# Patient Record
Sex: Female | Born: 1955 | Race: White | Hispanic: No | Marital: Married | Smoking: Never smoker
Health system: Southern US, Community
[De-identification: ages and names within clinical notes are randomized; demographics above are authoritative.]

## PROBLEM LIST (undated history)

## (undated) DIAGNOSIS — K219 Gastro-esophageal reflux disease without esophagitis: Secondary | ICD-10-CM

## (undated) DIAGNOSIS — Z973 Presence of spectacles and contact lenses: Secondary | ICD-10-CM

## (undated) DIAGNOSIS — T753XXA Motion sickness, initial encounter: Secondary | ICD-10-CM

## (undated) HISTORY — PX: ESOPHAGOGASTRODUODENOSCOPY: SHX1529

---

## 2007-12-08 ENCOUNTER — Ambulatory Visit: Payer: Self-pay | Admitting: Unknown Physician Specialty

## 2009-11-08 DIAGNOSIS — D229 Melanocytic nevi, unspecified: Secondary | ICD-10-CM

## 2009-11-08 HISTORY — DX: Melanocytic nevi, unspecified: D22.9

## 2014-05-31 ENCOUNTER — Ambulatory Visit: Payer: Self-pay | Admitting: Obstetrics and Gynecology

## 2016-06-29 ENCOUNTER — Other Ambulatory Visit: Payer: Self-pay | Admitting: Obstetrics and Gynecology

## 2016-06-29 DIAGNOSIS — Z1231 Encounter for screening mammogram for malignant neoplasm of breast: Secondary | ICD-10-CM

## 2016-06-29 DIAGNOSIS — Z13820 Encounter for screening for osteoporosis: Secondary | ICD-10-CM

## 2016-09-06 ENCOUNTER — Ambulatory Visit
Admission: RE | Admit: 2016-09-06 | Discharge: 2016-09-06 | Disposition: A | Discharge status: Home / Self Care | Payer: BC Managed Care – PPO | Source: Ambulatory Visit | Attending: Obstetrics and Gynecology | Admitting: Obstetrics and Gynecology

## 2016-09-06 ENCOUNTER — Encounter: Payer: Self-pay | Admitting: Radiology

## 2016-09-06 DIAGNOSIS — Z1231 Encounter for screening mammogram for malignant neoplasm of breast: Secondary | ICD-10-CM | POA: Diagnosis present

## 2017-02-03 ENCOUNTER — Ambulatory Visit: Payer: BC Managed Care – PPO | Admitting: Family

## 2017-07-06 ENCOUNTER — Other Ambulatory Visit: Payer: Self-pay | Admitting: Obstetrics and Gynecology

## 2017-07-06 DIAGNOSIS — Z13820 Encounter for screening for osteoporosis: Secondary | ICD-10-CM

## 2017-07-06 DIAGNOSIS — Z1231 Encounter for screening mammogram for malignant neoplasm of breast: Secondary | ICD-10-CM

## 2017-08-18 ENCOUNTER — Telehealth: Payer: Self-pay

## 2017-08-18 ENCOUNTER — Other Ambulatory Visit: Payer: Self-pay

## 2017-08-18 DIAGNOSIS — Z1211 Encounter for screening for malignant neoplasm of colon: Secondary | ICD-10-CM

## 2017-08-18 DIAGNOSIS — Z1212 Encounter for screening for malignant neoplasm of rectum: Principal | ICD-10-CM

## 2017-08-18 NOTE — Telephone Encounter (Signed)
Gastroenterology Pre-Procedure Review  Request Date: 10/19 Requesting Physician: Dr. Allen Norris  PATIENT REVIEW QUESTIONS: The patient responded to the following health history questions as indicated:    1. Are you having any GI issues? no 2. Do you have a personal history of Polyps? no 3. Do you have a family history of Colon Cancer or Polyps? no 4. Diabetes Mellitus? no 5. Joint replacements in the past 12 months?no 6. Major health problems in the past 3 months?no 7. Any artificial heart valves, MVP, or defibrillator?no    MEDICATIONS & ALLERGIES:    Patient reports the following regarding taking any anticoagulation/antiplatelet therapy:   Plavix, Coumadin, Eliquis, Xarelto, Lovenox, Pradaxa, Brilinta, or Effient? no Aspirin? no  Patient confirms/reports the following medications:  Current Outpatient Prescriptions  Medication Sig Dispense Refill  . B Complex Vitamins (VITAMIN B COMPLEX PO) Take by mouth.    . Calcium Carbonate 500 MG CHEW Chew by mouth.     No current facility-administered medications for this visit.     Patient confirms/reports the following allergies:  Allergies  Allergen Reactions  . Sulfa Antibiotics Rash    No orders of the defined types were placed in this encounter.   AUTHORIZATION INFORMATION Primary Insurance: 1D#: Group #:  Secondary Insurance: 1D#: Group #:  SCHEDULE INFORMATION: Date: 10/19  Time: Location: [place]

## 2017-08-22 ENCOUNTER — Encounter: Payer: Self-pay | Admitting: *Deleted

## 2017-08-23 NOTE — Discharge Instructions (Signed)
General Anesthesia, Adult, Care After °These instructions provide you with information about caring for yourself after your procedure. Your health care provider may also give you more specific instructions. Your treatment has been planned according to current medical practices, but problems sometimes occur. Call your health care provider if you have any problems or questions after your procedure. °What can I expect after the procedure? °After the procedure, it is common to have: °· Vomiting. °· A sore throat. °· Mental slowness. ° °It is common to feel: °· Nauseous. °· Cold or shivery. °· Sleepy. °· Tired. °· Sore or achy, even in parts of your body where you did not have surgery. ° °Follow these instructions at home: °For at least 24 hours after the procedure: °· Do not: °? Participate in activities where you could fall or become injured. °? Drive. °? Use heavy machinery. °? Drink alcohol. °? Take sleeping pills or medicines that cause drowsiness. °? Make important decisions or sign legal documents. °? Take care of children on your own. °· Rest. °Eating and drinking °· If you vomit, drink water, juice, or soup when you can drink without vomiting. °· Drink enough fluid to keep your urine clear or pale yellow. °· Make sure you have little or no nausea before eating solid foods. °· Follow the diet recommended by your health care provider. °General instructions °· Have a responsible adult stay with you until you are awake and alert. °· Return to your normal activities as told by your health care provider. Ask your health care provider what activities are safe for you. °· Take over-the-counter and prescription medicines only as told by your health care provider. °· If you smoke, do not smoke without supervision. °· Keep all follow-up visits as told by your health care provider. This is important. °Contact a health care provider if: °· You continue to have nausea or vomiting at home, and medicines are not helpful. °· You  cannot drink fluids or start eating again. °· You cannot urinate after 8-12 hours. °· You develop a skin rash. °· You have fever. °· You have increasing redness at the site of your procedure. °Get help right away if: °· You have difficulty breathing. °· You have chest pain. °· You have unexpected bleeding. °· You feel that you are having a life-threatening or urgent problem. °This information is not intended to replace advice given to you by your health care provider. Make sure you discuss any questions you have with your health care provider. °Document Released: 01/31/2001 Document Revised: 03/29/2016 Document Reviewed: 10/09/2015 °Elsevier Interactive Patient Education © 2018 [employer] ° °

## 2017-08-26 ENCOUNTER — Ambulatory Visit: Payer: BC Managed Care – PPO | Admitting: Anesthesiology

## 2017-08-26 ENCOUNTER — Encounter
Admission: RE | Disposition: A | Discharge status: Home / Self Care | Payer: Self-pay | Source: Ambulatory Visit | Attending: Gastroenterology

## 2017-08-26 ENCOUNTER — Ambulatory Visit
Admission: RE | Admit: 2017-08-26 | Discharge: 2017-08-26 | Disposition: A | Discharge status: Home / Self Care | Payer: BC Managed Care – PPO | Source: Ambulatory Visit | Attending: Gastroenterology | Admitting: Gastroenterology

## 2017-08-26 DIAGNOSIS — Z882 Allergy status to sulfonamides status: Secondary | ICD-10-CM | POA: Insufficient documentation

## 2017-08-26 DIAGNOSIS — K219 Gastro-esophageal reflux disease without esophagitis: Secondary | ICD-10-CM | POA: Diagnosis not present

## 2017-08-26 DIAGNOSIS — K635 Polyp of colon: Secondary | ICD-10-CM | POA: Diagnosis not present

## 2017-08-26 DIAGNOSIS — K640 First degree hemorrhoids: Secondary | ICD-10-CM | POA: Insufficient documentation

## 2017-08-26 DIAGNOSIS — D123 Benign neoplasm of transverse colon: Secondary | ICD-10-CM | POA: Diagnosis not present

## 2017-08-26 DIAGNOSIS — Z1211 Encounter for screening for malignant neoplasm of colon: Secondary | ICD-10-CM | POA: Diagnosis present

## 2017-08-26 DIAGNOSIS — K5730 Diverticulosis of large intestine without perforation or abscess without bleeding: Secondary | ICD-10-CM | POA: Diagnosis not present

## 2017-08-26 HISTORY — DX: Presence of spectacles and contact lenses: Z97.3

## 2017-08-26 HISTORY — DX: Gastro-esophageal reflux disease without esophagitis: K21.9

## 2017-08-26 HISTORY — PX: COLONOSCOPY WITH PROPOFOL: SHX5780

## 2017-08-26 HISTORY — PX: POLYPECTOMY: SHX5525

## 2017-08-26 HISTORY — DX: Motion sickness, initial encounter: T75.3XXA

## 2017-08-26 SURGERY — COLONOSCOPY WITH PROPOFOL
Anesthesia: General

## 2017-08-26 MED ORDER — LACTATED RINGERS IV SOLN
INTRAVENOUS | Status: DC
  Administered 2017-08-26: 11:00:00 via INTRAVENOUS

## 2017-08-26 MED ORDER — LIDOCAINE HCL (CARDIAC) 20 MG/ML IV SOLN
INTRAVENOUS | Status: DC | PRN
  Administered 2017-08-26: 50 mg via INTRAVENOUS

## 2017-08-26 MED ORDER — ACETAMINOPHEN 325 MG PO TABS: 325.0000 mg | ORAL_TABLET | Freq: Once | ORAL | Status: DC

## 2017-08-26 MED ORDER — ACETAMINOPHEN 160 MG/5ML PO SOLN: 325.0000 mg | Freq: Once | ORAL | Status: DC

## 2017-08-26 MED ORDER — PROPOFOL 10 MG/ML IV BOLUS
INTRAVENOUS | Status: DC | PRN
  Administered 2017-08-26 (×2): 40 mg via INTRAVENOUS
  Administered 2017-08-26 (×2): 20 mg via INTRAVENOUS
  Administered 2017-08-26: 40 mg via INTRAVENOUS
  Administered 2017-08-26: 100 mg via INTRAVENOUS

## 2017-08-26 MED ORDER — STERILE WATER FOR IRRIGATION IR SOLN
Status: DC | PRN
  Administered 2017-08-26: 11:00:00

## 2017-08-26 SURGICAL SUPPLY — 23 items
CANISTER SUCT 1200ML W/VALVE (MISCELLANEOUS) ×4 IMPLANT
CLIP HMST 235XBRD CATH ROT (MISCELLANEOUS) IMPLANT
CLIP RESOLUTION 360 11X235 (MISCELLANEOUS)
FCP ESCP3.2XJMB 240X2.8X (MISCELLANEOUS) ×2
FORCEPS BIOP RAD 4 LRG CAP 4 (CUTTING FORCEPS) IMPLANT
FORCEPS BIOP RJ4 240 W/NDL (MISCELLANEOUS) ×2
FORCEPS ESCP3.2XJMB 240X2.8X (MISCELLANEOUS) ×2 IMPLANT
GOWN CVR UNV OPN BCK APRN NK (MISCELLANEOUS) ×4 IMPLANT
GOWN ISOL THUMB LOOP REG UNIV (MISCELLANEOUS) ×4
INJECTOR VARIJECT VIN23 (MISCELLANEOUS) IMPLANT
KIT DEFENDO VALVE AND CONN (KITS) IMPLANT
KIT ENDO PROCEDURE OLY (KITS) ×4 IMPLANT
MARKER SPOT ENDO TATTOO 5ML (MISCELLANEOUS) IMPLANT
PAD GROUND ADULT SPLIT (MISCELLANEOUS) IMPLANT
PROBE APC STR FIRE (PROBE) IMPLANT
RETRIEVER NET ROTH 2.5X230 LF (MISCELLANEOUS) IMPLANT
SNARE SHORT THROW 13M SML OVAL (MISCELLANEOUS) IMPLANT
SNARE SHORT THROW 30M LRG OVAL (MISCELLANEOUS) IMPLANT
SNARE SNG USE RND 15MM (INSTRUMENTS) IMPLANT
SPOT EX ENDOSCOPIC TATTOO (MISCELLANEOUS)
TRAP ETRAP POLY (MISCELLANEOUS) IMPLANT
VARIJECT INJECTOR VIN23 (MISCELLANEOUS)
WATER STERILE IRR 250ML POUR (IV SOLUTION) ×4 IMPLANT

## 2017-08-26 NOTE — Anesthesia Preprocedure Evaluation (Signed)
Anesthesia Evaluation  Patient identified by MRN, date of birth, ID band Patient awake    Reviewed: Allergy & Precautions, H&P , NPO status , Patient's Chart, lab work & pertinent test results  Airway Mallampati: II  TM Distance: >3 FB Neck ROM: full    Dental no notable dental hx.    Pulmonary    Pulmonary exam normal breath sounds clear to auscultation       Cardiovascular Normal cardiovascular exam Rhythm:regular Rate:Normal     Neuro/Psych    GI/Hepatic GERD  ,  Endo/Other    Renal/GU      Musculoskeletal   Abdominal   Peds  Hematology   Anesthesia Other Findings   Reproductive/Obstetrics                             Anesthesia Physical Anesthesia Plan  ASA: II  Anesthesia Plan: General   Post-op Pain Management:    Induction:   PONV Risk Score and Plan: 3 and Propofol infusion  Airway Management Planned:   Additional Equipment:   Intra-op Plan:   Post-operative Plan:   Informed Consent: I have reviewed the patients History and Physical, chart, labs and discussed the procedure including the risks, benefits and alternatives for the proposed anesthesia with the patient or authorized representative who has indicated his/her understanding and acceptance.     Plan Discussed with: CRNA  Anesthesia Plan Comments:         Anesthesia Quick Evaluation

## 2017-08-26 NOTE — Anesthesia Procedure Notes (Signed)
Date/Time: 08/26/2017 11:08 AM Performed by: Cameron Ali Pre-anesthesia Checklist: Patient identified, Emergency Drugs available, Suction available, Timeout performed and Patient being monitored Patient Re-evaluated:Patient Re-evaluated prior to induction Oxygen Delivery Method: Nasal cannula Placement Confirmation: positive ETCO2

## 2017-08-26 NOTE — H&P (Signed)
   Lucilla Lame, MD [address] [place], [place] [address] Phone: [phone] Fax : [phone]  Primary Care Physician:  Elgie Collard, MD Primary Gastroenterologist:  Dr. Allen Norris  Pre-Procedure History & Physical: HPI:  Amber Solomon is a 61 y.o. female is here for a screening colonoscopy.   Past Medical History:  Diagnosis Date  . GERD (gastroesophageal reflux disease)   . Motion sickness    boats  . Wears contact lenses     Past Surgical History:  Procedure Laterality Date  . ESOPHAGOGASTRODUODENOSCOPY      Prior to Admission medications   Medication Sig Start Date End Date Taking? Authorizing Provider  B Complex Vitamins (VITAMIN B COMPLEX PO) Take by mouth.   Yes [provider]  Calcium Carbonate 500 MG CHEW Chew by mouth.   Yes [provider]  Multiple Vitamin (MULTIVITAMIN) tablet Take 1 tablet by mouth daily.   Yes [provider]    Allergies as of 08/18/2017 - never reviewed  Allergen Reaction Noted  . Sulfa antibiotics Rash 01/21/2016    Family History  Problem Relation Age of Onset  . Breast cancer Neg Hx     Social History   Social History  . Marital status: Married    Spouse name: N/A  . Number of children: N/A  . Years of education: N/A   Occupational History  . Not on file.   Social History Main Topics  . Smoking status: Never Smoker  . Smokeless tobacco: Never Used  . Alcohol use 3.6 oz/week    6 Cans of beer per week  . Drug use: Unknown  . Sexual activity: Not on file   Other Topics Concern  . Not on file   Social History Narrative  . No narrative on file    Review of Systems: See HPI, otherwise negative ROS  Physical Exam: BP (!) 139/98   Pulse 92   Temp 97.6 F (36.4 C) (Temporal)   Ht 4' 11.5" (1.511 m)   Wt 136 lb (61.7 kg)   SpO2 98%   BMI 27.01 kg/m  General:   Alert,  pleasant and cooperative in NAD Head:  Normocephalic and atraumatic. Neck:  Supple; no  masses or thyromegaly. Lungs:  Clear throughout to auscultation.    Heart:  Regular rate and rhythm. Abdomen:  Soft, nontender and nondistended. Normal bowel sounds, without guarding, and without rebound.   Neurologic:  Alert and  oriented x4;  grossly normal neurologically.  Impression/Plan: Amber Solomon is now here to undergo a screening colonoscopy.  Risks, benefits, and alternatives regarding colonoscopy have been reviewed with the patient.  Questions have been answered.  All parties agreeable.

## 2017-08-26 NOTE — Op Note (Addendum)
[facility] Gastroenterology Patient Name: Amber Solomon Procedure Date: 08/26/2017 11:03 AM MRN: [id number] Account #: [id number] Date of Birth: [birth year] Admit Type: Outpatient Age: 61 Room: [facility] OR ROOM 01 Gender: Female Note Status: Finalized Procedure:            Colonoscopy Indications:          Screening for colorectal malignant neoplasm Providers:            Lucilla Lame MD, MD Referring MD:         Shelby Mattocks. Georga Bora, MD (Referring MD) Medicines:            Propofol per Anesthesia Complications:        No immediate complications. Procedure:            Pre-Anesthesia Assessment:                       - Prior to the procedure, a History and Physical was                        performed, and patient medications and allergies were                        reviewed. The patient's tolerance of previous                        anesthesia was also reviewed. The risks and benefits of                        the procedure and the sedation options and risks were                        discussed with the patient. All questions were                        answered, and informed consent was obtained. Prior                        Anticoagulants: The patient has taken no previous                        anticoagulant or antiplatelet agents. ASA Grade                        Assessment: II - A patient with mild systemic disease.                        After reviewing the risks and benefits, the patient was                        deemed in satisfactory condition to undergo the                        procedure.                       After obtaining informed consent, the colonoscope was                        passed under direct vision. Throughout the procedure,  the patient's blood pressure, pulse, and oxygen                        saturations were monitored continuously. The [place] (S#: [id number]) was  introduced through                        the anus and advanced to the the cecum, identified by                        appendiceal orifice and ileocecal valve. The entire                        colon was examined. Findings:      The perianal and digital rectal examinations were normal.      A 4 mm polyp was found in the transverse colon. The polyp was sessile.       The polyp was removed with a cold biopsy forceps. Resection and       retrieval were complete.      A few small-mouthed diverticula were found in the sigmoid colon.      Non-bleeding internal hemorrhoids were found during retroflexion. The       hemorrhoids were Grade I (internal hemorrhoids that do not prolapse). Impression:           - One 4 mm polyp in the transverse colon, removed with                        a cold biopsy forceps. Resected and retrieved.                       - Diverticulosis in the sigmoid colon.                       - Non-bleeding internal hemorrhoids. Recommendation:       - Discharge patient to home.                       - Resume previous diet.                       - Continue present medications.                       - Await pathology results.                       - Repeat colonoscopy in 5 years if polyp adenoma and 10                        years if hyperplastic Procedure Code(s):    --- Professional ---                       [phone], Colonoscopy, flexible; with biopsy, single or                        multiple Diagnosis Code(s):    --- Professional ---  Z12.11, Encounter for screening for malignant neoplasm                        of colon                       D12.3, Benign neoplasm of transverse colon (hepatic                        flexure or splenic flexure) CPT copyright 2016 American Medical Association. All rights reserved. The codes documented in this report are preliminary and upon coder review may  be revised to meet current compliance requirements. Lucilla Lame  MD, MD 08/26/2017 11:30:21 AM This report has been signed electronically. Number of Addenda: 0 Note Initiated On: 08/26/2017 11:03 AM Scope Withdrawal Time: 0 hours 6 minutes 7 seconds  Total Procedure Duration: 0 hours 9 minutes 21 seconds       [facility]

## 2017-08-26 NOTE — Transfer of Care (Signed)
Immediate Anesthesia Transfer of Care Note  Patient: Amber Solomon  Procedure(s) Performed: COLONOSCOPY WITH PROPOFOL (N/A )  Patient Location: PACU  Anesthesia Type: General  Level of Consciousness: awake, alert  and patient cooperative  Airway and Oxygen Therapy: Patient Spontanous Breathing and Patient connected to supplemental oxygen  Post-op Assessment: Post-op Vital signs reviewed, Patient's Cardiovascular Status Stable, Respiratory Function Stable, Patent Airway and No signs of Nausea or vomiting  Post-op Vital Signs: Reviewed and stable  Complications: No apparent anesthesia complications

## 2017-08-26 NOTE — Anesthesia Postprocedure Evaluation (Signed)
Anesthesia Post Note  Patient: Amber Solomon  Procedure(s) Performed: COLONOSCOPY WITH PROPOFOL (N/A ) POLYPECTOMY  Patient location during evaluation: PACU Anesthesia Type: General Level of consciousness: awake and alert and oriented Pain management: satisfactory to patient Vital Signs Assessment: post-procedure vital signs reviewed and stable Respiratory status: spontaneous breathing, nonlabored ventilation and respiratory function stable Cardiovascular status: blood pressure returned to baseline and stable Postop Assessment: Adequate PO intake and No signs of nausea or vomiting Anesthetic complications: no    Raliegh Ip

## 2017-08-29 ENCOUNTER — Encounter: Payer: Self-pay | Admitting: Gastroenterology

## 2017-08-30 ENCOUNTER — Encounter: Payer: Self-pay | Admitting: Gastroenterology

## 2017-09-01 ENCOUNTER — Encounter: Payer: Self-pay | Admitting: Gastroenterology

## 2017-09-08 ENCOUNTER — Ambulatory Visit
Admission: RE | Admit: 2017-09-08 | Discharge: 2017-09-08 | Disposition: A | Discharge status: Home / Self Care | Payer: BC Managed Care – PPO | Source: Ambulatory Visit | Attending: Obstetrics and Gynecology | Admitting: Obstetrics and Gynecology

## 2017-09-08 DIAGNOSIS — Z1231 Encounter for screening mammogram for malignant neoplasm of breast: Secondary | ICD-10-CM | POA: Diagnosis not present

## 2019-01-15 ENCOUNTER — Other Ambulatory Visit: Payer: Self-pay | Admitting: Obstetrics and Gynecology

## 2019-01-15 DIAGNOSIS — Z1231 Encounter for screening mammogram for malignant neoplasm of breast: Secondary | ICD-10-CM

## 2019-01-15 DIAGNOSIS — Z13820 Encounter for screening for osteoporosis: Secondary | ICD-10-CM

## 2019-01-31 ENCOUNTER — Other Ambulatory Visit: Payer: BC Managed Care – PPO

## 2019-01-31 ENCOUNTER — Ambulatory Visit: Payer: BC Managed Care – PPO

## 2019-06-11 ENCOUNTER — Ambulatory Visit: Payer: BC Managed Care – PPO

## 2019-10-11 ENCOUNTER — Ambulatory Visit
Admission: RE | Admit: 2019-10-11 | Discharge: 2019-10-11 | Disposition: A | Discharge status: Home / Self Care | Payer: BC Managed Care – PPO | Source: Ambulatory Visit | Attending: Obstetrics and Gynecology | Admitting: Obstetrics and Gynecology

## 2019-10-11 ENCOUNTER — Other Ambulatory Visit: Payer: Self-pay

## 2019-10-11 ENCOUNTER — Encounter (INDEPENDENT_AMBULATORY_CARE_PROVIDER_SITE_OTHER): Payer: Self-pay

## 2019-10-11 DIAGNOSIS — Z1231 Encounter for screening mammogram for malignant neoplasm of breast: Secondary | ICD-10-CM | POA: Diagnosis not present

## 2019-11-12 ENCOUNTER — Ambulatory Visit: Payer: BC Managed Care – PPO

## 2020-12-25 ENCOUNTER — Other Ambulatory Visit: Payer: Self-pay | Admitting: Obstetrics and Gynecology

## 2020-12-25 DIAGNOSIS — Z1231 Encounter for screening mammogram for malignant neoplasm of breast: Secondary | ICD-10-CM

## 2021-01-12 ENCOUNTER — Ambulatory Visit
Admission: RE | Admit: 2021-01-12 | Discharge: 2021-01-12 | Disposition: A | Discharge status: Home / Self Care | Payer: BC Managed Care – PPO | Source: Ambulatory Visit | Attending: Obstetrics and Gynecology | Admitting: Obstetrics and Gynecology

## 2021-01-12 DIAGNOSIS — Z1231 Encounter for screening mammogram for malignant neoplasm of breast: Secondary | ICD-10-CM | POA: Diagnosis not present

## 2021-02-17 ENCOUNTER — Encounter: Payer: BC Managed Care – PPO | Admitting: Dermatology

## 2021-02-24 ENCOUNTER — Encounter: Payer: BC Managed Care – PPO | Admitting: Dermatology

## 2021-11-17 ENCOUNTER — Ambulatory Visit: Payer: BC Managed Care – PPO | Admitting: Dermatology

## 2021-12-24 ENCOUNTER — Other Ambulatory Visit: Payer: Self-pay | Admitting: Family Medicine

## 2021-12-24 DIAGNOSIS — Z1231 Encounter for screening mammogram for malignant neoplasm of breast: Secondary | ICD-10-CM

## 2022-03-01 ENCOUNTER — Ambulatory Visit
Admission: RE | Admit: 2022-03-01 | Discharge: 2022-03-01 | Disposition: A | Discharge status: Home / Self Care | Payer: Medicare PPO | Source: Ambulatory Visit | Attending: Family Medicine | Admitting: Family Medicine

## 2022-03-01 DIAGNOSIS — Z1231 Encounter for screening mammogram for malignant neoplasm of breast: Secondary | ICD-10-CM | POA: Diagnosis present

## 2022-07-16 IMAGING — MG MM DIGITAL SCREENING BILAT W/ TOMO AND CAD
8 series · 8 of 24 positions shown · non-contrast
Comparison: Previous exam(s).

CLINICAL DATA: Screening.

EXAM:
DIGITAL SCREENING BILATERAL MAMMOGRAM WITH TOMOSYNTHESIS AND CAD
TECHNIQUE: Bilateral screening digital craniocaudal and mediolateral oblique
mammograms were obtained. Bilateral screening digital breast
tomosynthesis was performed. The images were evaluated with
computer-aided detection.

[R MLO synth-2D]
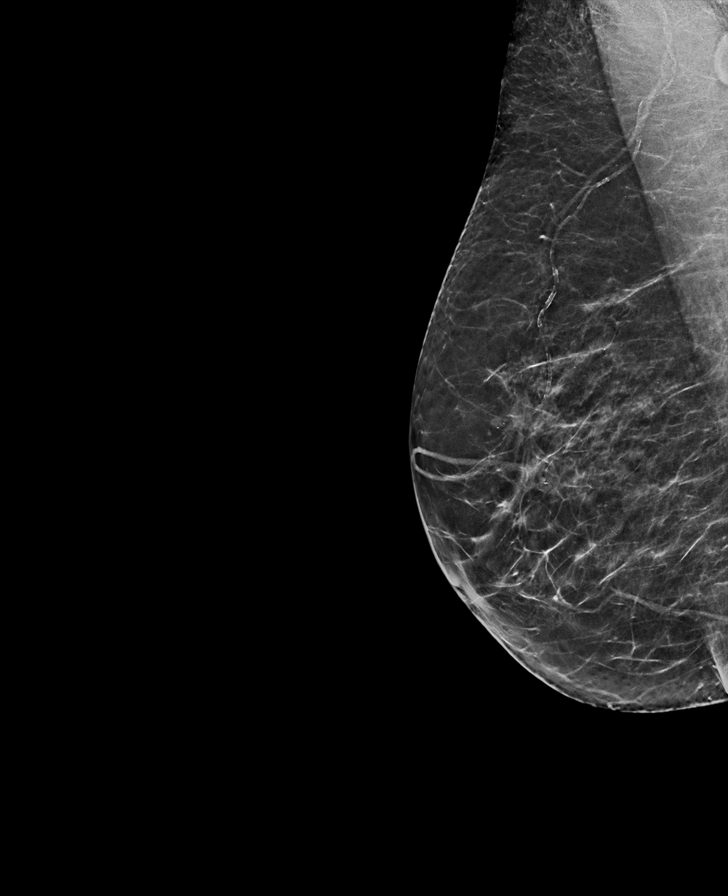

[L MLO synth-2D]
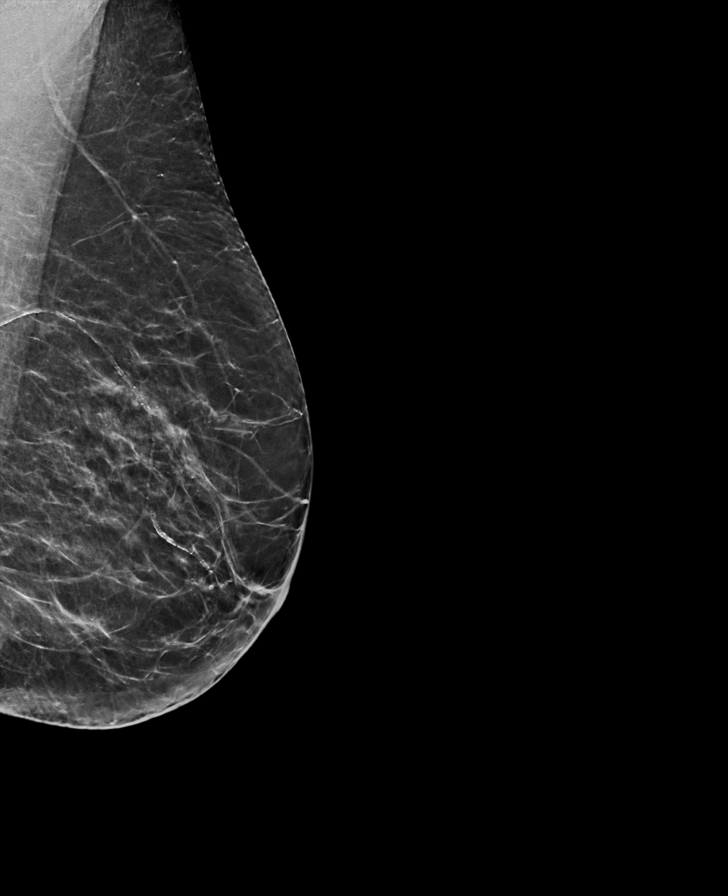

[L CC synth-2D]
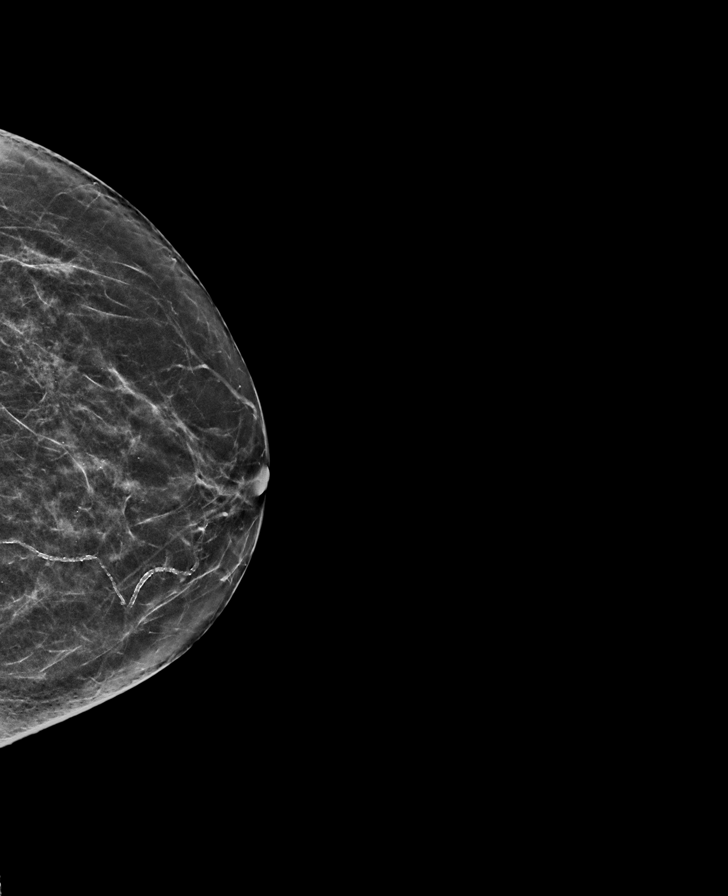

[R CC synth-2D]
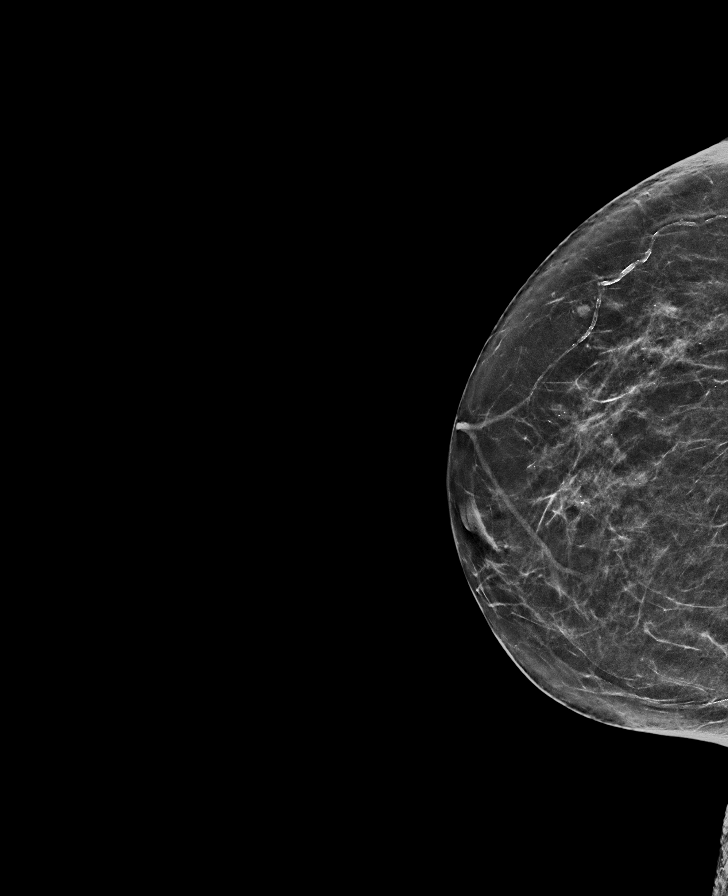

[R CC tomo · tomo slice 31/62.0]
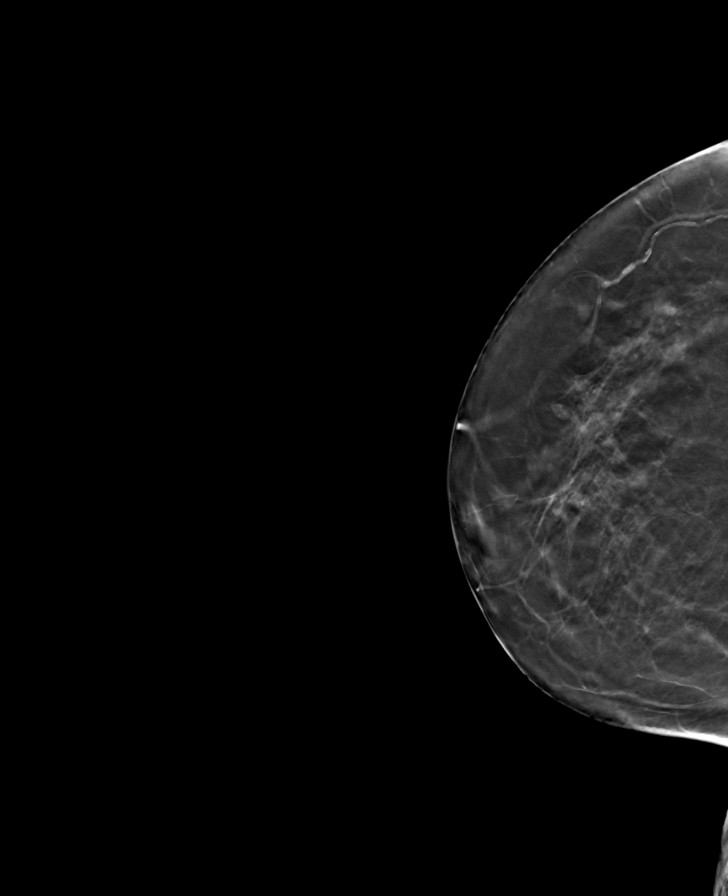

[L CC tomo · tomo slice 30/59.0]
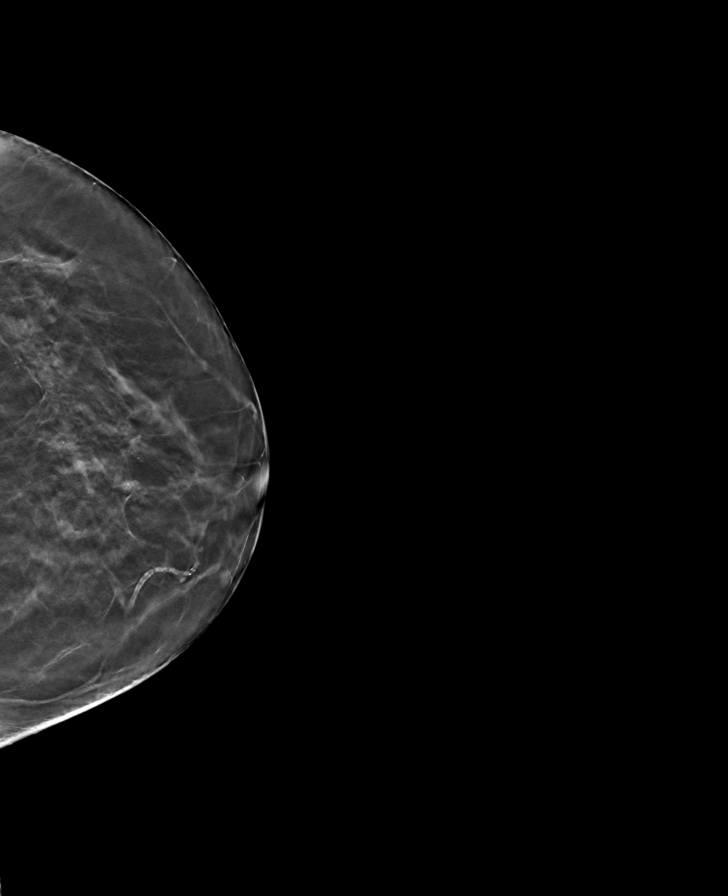

[R MLO tomo · tomo slice 32/63.0]
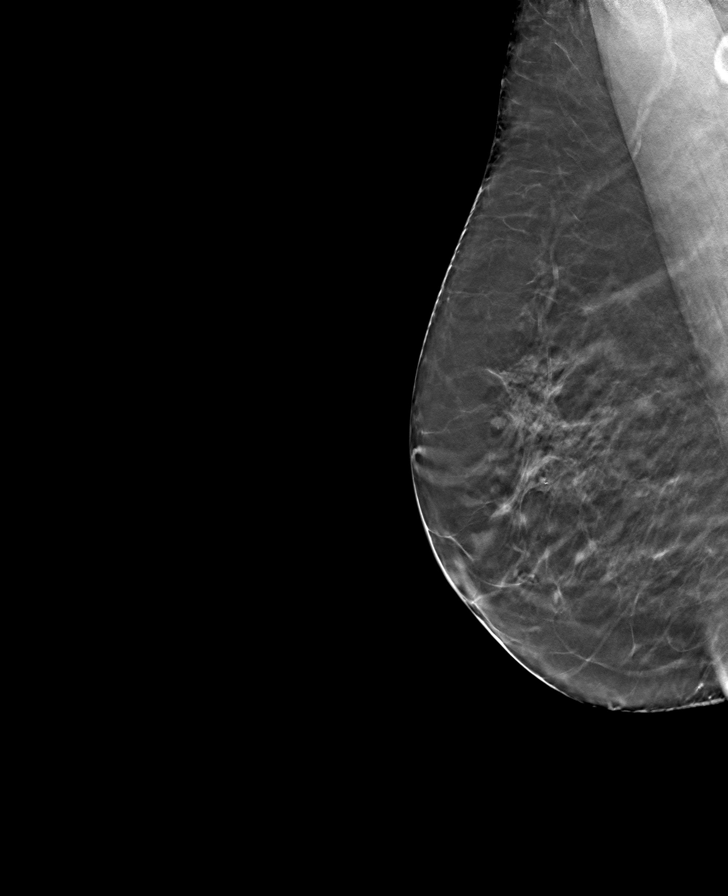

[L MLO tomo · tomo slice 33/64.0]
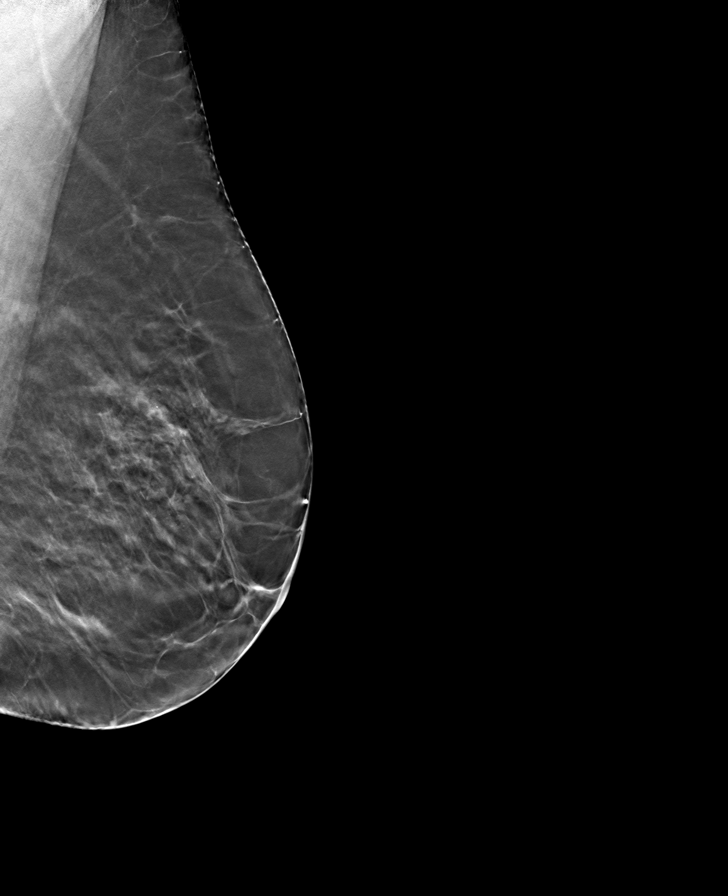

[8 of 24 positions shown; findings below may reference images not displayed]

ACR Breast Density Category b: There are scattered areas of
fibroglandular density.
FINDINGS: There are no findings suspicious for malignancy.
IMPRESSION: No mammographic evidence of malignancy. A result letter of this
screening mammogram will be mailed directly to the patient.

RECOMMENDATION:
Screening mammogram in one year. (Code:51-O-LD2)

BI-RADS CATEGORY  1: Negative.

## 2022-08-04 ENCOUNTER — Ambulatory Visit: Payer: Self-pay | Admitting: Family Medicine

## 2022-12-14 ENCOUNTER — Ambulatory Visit: Payer: Medicare PPO | Admitting: Dermatology

## 2022-12-14 ENCOUNTER — Encounter: Payer: Self-pay | Admitting: Dermatology

## 2022-12-14 VITALS — BP 153/84 | HR 80

## 2022-12-14 DIAGNOSIS — D2272 Melanocytic nevi of left lower limb, including hip: Secondary | ICD-10-CM

## 2022-12-14 DIAGNOSIS — Z1283 Encounter for screening for malignant neoplasm of skin: Secondary | ICD-10-CM | POA: Diagnosis not present

## 2022-12-14 DIAGNOSIS — D2371 Other benign neoplasm of skin of right lower limb, including hip: Secondary | ICD-10-CM

## 2022-12-14 DIAGNOSIS — D225 Melanocytic nevi of trunk: Secondary | ICD-10-CM | POA: Diagnosis not present

## 2022-12-14 DIAGNOSIS — Z86018 Personal history of other benign neoplasm: Secondary | ICD-10-CM

## 2022-12-14 DIAGNOSIS — D229 Melanocytic nevi, unspecified: Secondary | ICD-10-CM

## 2022-12-14 DIAGNOSIS — L578 Other skin changes due to chronic exposure to nonionizing radiation: Secondary | ICD-10-CM

## 2022-12-14 DIAGNOSIS — D2372 Other benign neoplasm of skin of left lower limb, including hip: Secondary | ICD-10-CM

## 2022-12-14 DIAGNOSIS — L719 Rosacea, unspecified: Secondary | ICD-10-CM

## 2022-12-14 DIAGNOSIS — L820 Inflamed seborrheic keratosis: Secondary | ICD-10-CM | POA: Diagnosis not present

## 2022-12-14 DIAGNOSIS — L821 Other seborrheic keratosis: Secondary | ICD-10-CM

## 2022-12-14 DIAGNOSIS — L720 Epidermal cyst: Secondary | ICD-10-CM

## 2022-12-14 DIAGNOSIS — L918 Other hypertrophic disorders of the skin: Secondary | ICD-10-CM

## 2022-12-14 DIAGNOSIS — L814 Other melanin hyperpigmentation: Secondary | ICD-10-CM

## 2022-12-14 NOTE — Patient Instructions (Addendum)
Cryotherapy Aftercare  Wash gently with soap and water everyday.   Apply Vaseline and Band-Aid daily until healed.    Melanoma ABCDEs  Melanoma is the most dangerous type of skin cancer, and is the leading cause of death from skin disease.  You are more likely to develop melanoma if you: Have light-colored skin, light-colored eyes, or red or blond hair Spend a lot of time in the sun Tan regularly, either outdoors or in a tanning bed Have had blistering sunburns, especially during childhood Have a close family member who has had a melanoma Have atypical moles or large birthmarks  Early detection of melanoma is key since treatment is typically straightforward and cure rates are extremely high if we catch it early.   The first sign of melanoma is often a change in a mole or a new dark spot.  The ABCDE system is a way of remembering the signs of melanoma.  A for asymmetry:  The two halves do not match. B for border:  The edges of the growth are irregular. C for color:  A mixture of colors are present instead of an even brown color. D for diameter:  Melanomas are usually (but not always) greater than 63m - the size of a pencil eraser. E for evolution:  The spot keeps changing in size, shape, and color.  Please check your skin once per month between visits. You can use a small mirror in front and a large mirror behind you to keep an eye on the back side or your body.   If you see any new or changing lesions before your next follow-up, please call to schedule a visit.  Please continue daily skin protection including broad spectrum sunscreen SPF 30+ to sun-exposed areas, reapplying every 2 hours as needed when you're outdoors.   Staying in the shade or wearing long sleeves, sun glasses (UVA+UVB protection) and wide brim hats (4-inch brim around the entire circumference of the hat) are also recommended for sun protection.     Seborrheic Keratosis  What causes seborrheic  keratoses? Seborrheic keratoses are harmless, common skin growths that first appear during adult life.  As time goes by, more growths appear.  Some people may develop a large number of them.  Seborrheic keratoses appear on both covered and uncovered body parts.  They are not caused by sunlight.  The tendency to develop seborrheic keratoses can be inherited.  They vary in color from skin-colored to gray, brown, or even black.  They can be either smooth or have a rough, warty surface.   Seborrheic keratoses are superficial and look as if they were stuck on the skin.  Under the microscope this type of keratosis looks like layers upon layers of skin.  That is why at times the top layer may seem to fall off, but the rest of the growth remains and re-grows.    Treatment Seborrheic keratoses do not need to be treated, but can easily be removed in the office.  Seborrheic keratoses often cause symptoms when they rub on clothing or jewelry.  Lesions can be in the way of shaving.  If they become inflamed, they can cause itching, soreness, or burning.  Removal of a seborrheic keratosis can be accomplished by freezing, burning, or surgery. If any spot bleeds, scabs, or grows rapidly, please return to have it checked, as these can be an indication of a skin cancer.   Due to recent changes in healthcare laws, you may see results of your pathology  and/or laboratory studies on MyChart before the doctors have had a chance to review them. We understand that in some cases there may be results that are confusing or concerning to you. Please understand that not all results are received at the same time and often the doctors may need to interpret multiple results in order to provide you with the best plan of care or course of treatment. Therefore, we ask that you please give [place] 2 business days to thoroughly review all your results before contacting the office for clarification. Should we see a critical lab result, you will be  contacted sooner.   If You Need Anything After Your Visit  If you have any questions or concerns for your doctor, please call our main line at [phone] and press option 4 to reach your doctor's medical assistant. If no one answers, please leave a voicemail as directed and we will return your call as soon as possible. Messages left after 4 pm will be answered the following business day.   You may also send [place] a message via [place]. We typically respond to MyChart messages within 1-2 business days.  For prescription refills, please ask your pharmacy to contact our office. Our fax number is [phone].  If you have an urgent issue when the clinic is closed that cannot wait until the next business day, you can page your doctor at the number below.    Please note that while we do our best to be available for urgent issues outside of office hours, we are not available 24/7.   If you have an urgent issue and are unable to reach [place], you may choose to seek medical care at your doctor's office, retail clinic, urgent care center, or emergency room.  If you have a medical emergency, please immediately call 911 or go to the emergency department.  Pager Numbers  - Dr. Nehemiah Massed: [phone]  - Dr. Laurence Ferrari: [phone]  - Dr. Nicole Kindred: [phone]  In the event of inclement weather, please call our main line at [phone] for an update on the status of any delays or closures.  Dermatology Medication Tips: Please keep the boxes that topical medications come in in order to help keep track of the instructions about where and how to use these. Pharmacies typically print the medication instructions only on the boxes and not directly on the medication tubes.   If your medication is too expensive, please contact our office at [phone] option 4 or send [place] a message through [place].   We are unable to tell what your co-pay for medications will be in advance as this is different depending on your  insurance coverage. However, we may be able to find a substitute medication at lower cost or fill out paperwork to get insurance to cover a needed medication.   If a prior authorization is required to get your medication covered by your insurance company, please allow [place] 1-2 business days to complete this process.  Drug prices often vary depending on where the prescription is filled and some pharmacies may offer cheaper prices.  The website www.goodrx.com contains coupons for medications through different pharmacies. The prices here do not account for what the cost may be with help from insurance (it may be cheaper with your insurance), but the website can give you the price if you did not use any insurance.  - You can print the associated coupon and take it with your prescription to the pharmacy.  - You may also stop by our  office during regular business hours and pick up a GoodRx coupon card.  - If you need your prescription sent electronically to a different pharmacy, notify our office through [facility] or by phone at [phone] option 4.     Si Usted Necesita Algo Despus de Su Visita  Tambin puede enviarnos un mensaje a travs de Pharmacist, community. Por lo general respondemos a los mensajes de MyChart en el transcurso de 1 a 2 das hbiles.  Para renovar recetas, por favor pida a su farmacia que se ponga en contacto con nuestra oficina. Harland Dingwall de fax es [place] [phone].  Si tiene un asunto urgente cuando la clnica est cerrada y que no puede esperar hasta el siguiente da hbil, puede llamar/localizar a su doctor(a) al nmero que aparece a continuacin.   Por favor, tenga en cuenta que aunque hacemos todo lo posible para estar disponibles para asuntos urgentes fuera del horario de [place], no estamos disponibles las 24 horas del da, los 7 das de la [place].   Si tiene un problema urgente y no puede comunicarse con nosotros, puede optar por buscar atencin mdica  en el  consultorio de su doctor(a), en una clnica privada, en un centro de atencin urgente o en una sala de emergencias.  Si tiene Engineering geologist, por favor llame inmediatamente al 911 o vaya a la sala de emergencias.  Nmeros de bper  - Dr. Nehemiah Massed: [phone]  - Dra. Moye: [phone]  - Dra. Nicole Kindred: [phone]  En caso de inclemencias del [place], por favor llame a Johnsie Kindred principal al [phone] para una actualizacin sobre el [place] de cualquier retraso o cierre.  Consejos para la medicacin en dermatologa: Por favor, guarde las cajas en las que vienen los medicamentos de uso tpico para ayudarle a seguir las instrucciones sobre dnde y cmo usarlos. Las farmacias generalmente imprimen las instrucciones del medicamento slo en las cajas y no directamente en los tubos del [place].   Si su medicamento es muy caro, por favor, pngase en contacto con Zigmund Daniel llamando al [phone] y presione la opcin 4 o envenos un mensaje a travs de Pharmacist, community.   No podemos decirle cul ser su copago por los medicamentos por adelantado ya que esto es diferente dependiendo de la cobertura de su seguro. Sin embargo, es posible que podamos encontrar un medicamento sustituto a Electrical engineer un formulario para que el seguro cubra el medicamento que se considera necesario.   Si se requiere una autorizacin previa para que su compaa de seguros [place] su medicamento, por favor permtanos de 1 a 2 das hbiles para completar este proceso.  Los precios de los medicamentos varan con frecuencia dependiendo del Environmental consultant de dnde se surte la receta y alguna farmacias pueden ofrecer precios ms baratos.  El sitio web www.goodrx.com tiene cupones para medicamentos de Airline pilot. Los precios aqu no tienen en cuenta lo que podra costar con la ayuda del seguro (puede ser ms barato con su seguro), pero el sitio web puede darle el precio si no utiliz Research scientist (physical sciences).  - Puede  imprimir el cupn correspondiente y llevarlo con su receta a la farmacia.  - Tambin puede pasar por nuestra oficina durante el horario de atencin regular y Charity fundraiser una tarjeta de cupones de GoodRx.  - Si necesita que su receta se enve electrnicamente a una farmacia diferente, informe a nuestra oficina a travs de MyChart de [place] o por telfono llamando al [phone] y presione la opcin 4.

## 2022-12-14 NOTE — Progress Notes (Signed)
Follow-Up Visit   Subjective  Amber Solomon is a 67 y.o. female who presents for the following: TBSE.  The patient presents for Total-Body Skin Exam (TBSE) for skin cancer screening and mole check.  The patient has spots, moles and lesions to be evaluated, some may be new or changing. She has a spot on her right lower neck that she thinks has gotten darker and gets caught on jewelry. Also a growth on her left forehead she would like checked. History of dysplastic nevus of the right lateral back inferior.      The following portions of the chart were reviewed this encounter and updated as appropriate:       Review of Systems:  No other skin or systemic complaints except as noted in HPI or Assessment and Plan.  Objective  Well appearing patient in no apparent distress; mood and affect are within normal limits.  A full examination was performed including scalp, head, eyes, ears, nose, lips, neck, chest, axillae, abdomen, back, buttocks, bilateral upper extremities, bilateral lower extremities, hands, feet, fingers, toes, fingernails, and toenails. All findings within normal limits unless otherwise noted below.  L med ankle 3.0 mm speckled brown papule  R epigastric 5.0 mm med dark brown macule  L epigastric 2.5 mm med dark brown macule         Head - Anterior (Face) Mid face erythema with telangiectasias, few small papules   R lower neck x 1 Erythematous stuck-on, waxy papule   L spinal upper back Subcutaneous nodule with punctum    Assessment & Plan  Skin cancer screening performed today.  Actinic Damage - chronic, secondary to cumulative UV radiation exposure/sun exposure over time - diffuse scaly erythematous macules with underlying dyspigmentation - Recommend daily broad spectrum sunscreen SPF 30+ to sun-exposed areas, reapply every 2 hours as needed.  - Recommend staying in the shade or wearing long sleeves, sun glasses (UVA+UVB protection) and wide  brim hats (4-inch brim around the entire circumference of the hat). - Call for new or changing lesions.  Lentigines - Scattered tan macules - Due to sun exposure - Benign-appearing, observe - Recommend daily broad spectrum sunscreen SPF 30+ to sun-exposed areas, reapply every 2 hours as needed. - Call for any changes  Seborrheic Keratoses - Stuck-on, waxy, tan-brown papules and/or plaques, including left forehead  - Benign-appearing - Discussed benign etiology and prognosis. - Observe - Call for any changes  Acrochordons (Skin Tags) - Fleshy, skin-colored pedunculated papules, L lower back - Benign appearing.  - Observe. - If desired, they can be removed with an in office procedure that is not covered by insurance. - Please call the clinic if you notice any new or changing lesions.  Hemangiomas - Red papules - Discussed benign nature - Observe - Call for any changes  Dermatofibroma - Firm pink/brown papulenodule with dimple sign, L post upper arm, R ankle, L calf - Benign appearing - Call for any changes  Melanocytic Nevi - Tan-brown and/or pink-flesh-colored symmetric macules and papules - Benign appearing on exam today - Observation - Call clinic for new or changing moles - Recommend daily use of broad spectrum spf 30+ sunscreen to sun-exposed areas.   History of Dysplastic Nevus - No evidence of recurrence today of the right lateral back inferior - Recommend regular full body skin exams - Recommend daily broad spectrum sunscreen SPF 30+ to sun-exposed areas, reapply every 2 hours as needed.  - Call if any new or changing lesions are noted  between office visits  Nevus (3) L med ankle; L epigastric; R epigastric  Benign-appearing, stable from previous visit 12/24/2019.  Observation.  Call clinic for new or changing moles.  Recommend daily use of broad spectrum spf 30+ sunscreen to sun-exposed areas.   Rosacea Head - Anterior (Face)  Rosacea is a chronic  progressive skin condition usually affecting the face of adults, causing redness and/or acne bumps. It is treatable but not curable. It sometimes affects the eyes (ocular rosacea) as well. It may respond to topical and/or systemic medication and can flare with stress, sun exposure, alcohol, exercise, topical steroids (including hydrocortisone/cortisone 10) and some foods.  Daily application of broad spectrum spf 30+ sunscreen to face is recommended to reduce flares.  Mild, not bothersome to patient. No treatment at this time other than daily sunscreen.  Inflamed seborrheic keratosis R lower neck x 1  Symptomatic, irritating, patient would like treated.  Destruction of lesion - R lower neck x 1  Destruction method: cryotherapy   Informed consent: discussed and consent obtained   Lesion destroyed using liquid nitrogen: Yes   Region frozen until ice ball extended beyond lesion: Yes   Outcome: patient tolerated procedure well with no complications   Post-procedure details: wound care instructions given   Additional details:  Prior to procedure, discussed risks of blister formation, small wound, skin dyspigmentation, or rare scar following cryotherapy. Recommend Vaseline ointment to treated areas while healing.   Epidermal cyst L spinal upper back  Benign-appearing. Exam most consistent with an epidermal inclusion cyst. Discussed that a cyst is a benign growth that can grow over time and sometimes get irritated or inflamed. Recommend observation if it is not bothersome. Discussed option of surgical excision to remove it if it is growing, symptomatic, or other changes noted. Please call for new or changing lesions so they can be evaluated.     Return in about 1 year (around 12/15/2023) for TBSE.  IJamesetta Orleans, CMA, am acting as scribe for Brendolyn Patty, MD .  Documentation: I have reviewed the above documentation for accuracy and completeness, and I agree with the above.  Brendolyn Patty MD

## 2022-12-15 ENCOUNTER — Ambulatory Visit: Payer: Medicare PPO | Admitting: Dermatology

## 2023-02-01 ENCOUNTER — Other Ambulatory Visit: Payer: Self-pay | Admitting: Family Medicine

## 2023-02-01 DIAGNOSIS — Z1231 Encounter for screening mammogram for malignant neoplasm of breast: Secondary | ICD-10-CM

## 2023-03-23 ENCOUNTER — Ambulatory Visit
Admission: RE | Admit: 2023-03-23 | Discharge: 2023-03-23 | Disposition: A | Discharge status: Home / Self Care | Payer: Medicare PPO | Source: Ambulatory Visit | Attending: Family Medicine | Admitting: Family Medicine

## 2023-03-23 DIAGNOSIS — Z1231 Encounter for screening mammogram for malignant neoplasm of breast: Secondary | ICD-10-CM | POA: Insufficient documentation

## 2023-12-20 ENCOUNTER — Ambulatory Visit: Payer: Medicare PPO | Admitting: Dermatology

## 2023-12-20 DIAGNOSIS — D239 Other benign neoplasm of skin, unspecified: Secondary | ICD-10-CM

## 2023-12-20 DIAGNOSIS — L918 Other hypertrophic disorders of the skin: Secondary | ICD-10-CM

## 2023-12-20 DIAGNOSIS — L578 Other skin changes due to chronic exposure to nonionizing radiation: Secondary | ICD-10-CM | POA: Diagnosis not present

## 2023-12-20 DIAGNOSIS — D2371 Other benign neoplasm of skin of right lower limb, including hip: Secondary | ICD-10-CM

## 2023-12-20 DIAGNOSIS — C4491 Basal cell carcinoma of skin, unspecified: Secondary | ICD-10-CM

## 2023-12-20 DIAGNOSIS — C44319 Basal cell carcinoma of skin of other parts of face: Secondary | ICD-10-CM | POA: Diagnosis not present

## 2023-12-20 DIAGNOSIS — Z1283 Encounter for screening for malignant neoplasm of skin: Secondary | ICD-10-CM

## 2023-12-20 DIAGNOSIS — L729 Follicular cyst of the skin and subcutaneous tissue, unspecified: Secondary | ICD-10-CM

## 2023-12-20 DIAGNOSIS — L821 Other seborrheic keratosis: Secondary | ICD-10-CM

## 2023-12-20 DIAGNOSIS — D492 Neoplasm of unspecified behavior of bone, soft tissue, and skin: Secondary | ICD-10-CM | POA: Diagnosis not present

## 2023-12-20 DIAGNOSIS — D225 Melanocytic nevi of trunk: Secondary | ICD-10-CM

## 2023-12-20 DIAGNOSIS — C44310 Basal cell carcinoma of skin of unspecified parts of face: Secondary | ICD-10-CM

## 2023-12-20 DIAGNOSIS — W908XXA Exposure to other nonionizing radiation, initial encounter: Secondary | ICD-10-CM

## 2023-12-20 DIAGNOSIS — D1801 Hemangioma of skin and subcutaneous tissue: Secondary | ICD-10-CM

## 2023-12-20 DIAGNOSIS — D485 Neoplasm of uncertain behavior of skin: Secondary | ICD-10-CM

## 2023-12-20 DIAGNOSIS — D2361 Other benign neoplasm of skin of right upper limb, including shoulder: Secondary | ICD-10-CM

## 2023-12-20 DIAGNOSIS — L814 Other melanin hyperpigmentation: Secondary | ICD-10-CM

## 2023-12-20 DIAGNOSIS — D2272 Melanocytic nevi of left lower limb, including hip: Secondary | ICD-10-CM

## 2023-12-20 DIAGNOSIS — D229 Melanocytic nevi, unspecified: Secondary | ICD-10-CM

## 2023-12-20 DIAGNOSIS — Z86018 Personal history of other benign neoplasm: Secondary | ICD-10-CM

## 2023-12-20 DIAGNOSIS — L719 Rosacea, unspecified: Secondary | ICD-10-CM

## 2023-12-20 HISTORY — DX: Basal cell carcinoma of skin, unspecified: C44.91

## 2023-12-20 NOTE — Patient Instructions (Addendum)

## 2023-12-20 NOTE — Progress Notes (Signed)
Follow-Up Visit   Subjective  Amber Solomon is a 68 y.o. female who presents for the following: Skin Cancer Screening and Full Body Skin Exam  The patient presents for Total-Body Skin Exam (TBSE) for skin cancer screening and mole check. The patient has spots, moles and lesions to be evaluated, some may be new or changing and the patient may have concern these could be cancer.  The following portions of the chart were reviewed this encounter and updated as appropriate: medications, allergies, medical history  Review of Systems:  No other skin or systemic complaints except as noted in HPI or Assessment and Plan.  Objective  Well appearing patient in no apparent distress; mood and affect are within normal limits.  A full examination was performed including scalp, head, eyes, ears, nose, lips, neck, chest, axillae, abdomen, back, buttocks, bilateral upper extremities, bilateral lower extremities, hands, feet, fingers, toes, fingernails, and toenails. All findings within normal limits unless otherwise noted below.   Relevant physical exam findings are noted in the Assessment and Plan.  L infraocular 0.4 cm pink pearly papule   Assessment & Plan   SKIN CANCER SCREENING PERFORMED TODAY.  ACTINIC DAMAGE - Chronic condition, secondary to cumulative UV/sun exposure - diffuse scaly erythematous macules with underlying dyspigmentation - Recommend daily broad spectrum sunscreen SPF 30+ to sun-exposed areas, reapply every 2 hours as needed.  - Staying in the shade or wearing long sleeves, sun glasses (UVA+UVB protection) and wide brim hats (4-inch brim around the entire circumference of the hat) are also recommended for sun protection.  - Call for new or changing lesions.  LENTIGINES, SEBORRHEIC KERATOSES, HEMANGIOMAS - Benign normal skin lesions - Benign-appearing - Call for any changes  MELANOCYTIC NEVI - L med ankle 0.3 cm speckled brown papule. - R epigastric 0.5 cm med dark  brown macule. - L epigastric 0.25 cm med dark brown macule. - R inf breast 0.6 cm pink brown papule. Nevus vs SK - Tan-brown and/or pink-flesh-colored symmetric macules and papules - Benign appearing on exam today, Stable - Observation - Call clinic for new or changing moles - Recommend daily use of broad spectrum spf 30+ sunscreen to sun-exposed areas.   HISTORY OF DYSPLASTIC NEVUS - R lat back inf, 2011 No evidence of recurrence today Recommend regular full body skin exams Recommend daily broad spectrum sunscreen SPF 30+ to sun-exposed areas, reapply every 2 hours as needed.  Call if any new or changing lesions are noted between office visits  Acrochordons (Skin Tags) - Fleshy, skin-colored pedunculated papules - Benign appearing.  - Observe. - If desired, they can be removed with an in office procedure that is not covered by insurance. - Please call the clinic if you notice any new or changing lesions.  DERMATOFIBROMA - L post upper arm, R ankle, L calf Exam: Firm pink/brown papulenodule with dimple sign. Treatment Plan: A dermatofibroma is a benign growth possibly related to trauma, such as an insect bite, cut from shaving, or inflamed acne-type bump.  Treatment options to remove include shave or excision with resulting scar and risk of recurrence.  Since benign-appearing and not bothersome, will observe for now.   ROSACEA Exam Mid face erythema with telangiectasias  Chronic condition with duration or expected duration over one year. Currently well-controlled.  Rosacea is a chronic progressive skin condition usually affecting the face of adults, causing redness and/or acne bumps. It is treatable but not curable. It sometimes affects the eyes (ocular rosacea) as well. It may respond  to topical and/or systemic medication and can flare with stress, sun exposure, alcohol, exercise, topical steroids (including hydrocortisone/cortisone 10) and some foods.  Daily application of broad  spectrum spf 30+ sunscreen to face is recommended to reduce flares.  Treatment Plan Mild, no tx needed.  EPIDERMAL INCLUSION CYST Exam: Subcutaneous nodule at L spinal upper back  Benign-appearing. Exam most consistent with an epidermal inclusion cyst. Discussed that a cyst is a benign growth that can grow over time and sometimes get irritated or inflamed. Recommend observation if it is not bothersome. Discussed option of surgical excision to remove it if it is growing, symptomatic, or other changes noted. Please call for new or changing lesions so they can be evaluated. NEOPLASM OF UNCERTAIN BEHAVIOR OF SKIN L infraocular Epidermal / dermal shaving  Lesion diameter (cm):  0.4 Informed consent: discussed and consent obtained   Patient was prepped and draped in usual sterile fashion: area prepped with alcohol. Anesthesia: the lesion was anesthetized in a standard fashion   Anesthetic:  1% lidocaine w/ epinephrine 1-100,000 buffered w/ 8.4% NaHCO3 Instrument used: flexible razor blade   Hemostasis achieved with: pressure, aluminum chloride and electrodesiccation   Outcome: patient tolerated procedure well    Destruction of lesion  Destruction method: electrodesiccation and curettage   Informed consent: discussed and consent obtained   Curettage performed in three different directions: Yes   Electrodesiccation performed over the curetted area: Yes   Final wound size (cm):  0.6 Hemostasis achieved with:  pressure, aluminum chloride and electrodesiccation Outcome: patient tolerated procedure well with no complications   Post-procedure details: wound care instructions given   Specimen 1 - Surgical pathology Differential Diagnosis: D48.5 r/o BCC ED&C today Check Margins: No  HEMANGIOMA vs Purpura Exam:  0.25 cm violaceous macule R plantar foot at ball. Benign features under dermoscopy.  Treatment Plan:  Discussed benign nature. Recommend observation. Call for changes.  Return in  about 1 year (around 12/19/2024) for TBSE.  Maylene Roes, CMA, am acting as scribe for Willeen Niece, MD .   Documentation: I have reviewed the above documentation for accuracy and completeness, and I agree with the above.  Willeen Niece, MD

## 2023-12-22 LAB — SURGICAL PATHOLOGY

## 2023-12-26 ENCOUNTER — Telehealth: Payer: Self-pay

## 2023-12-26 NOTE — Telephone Encounter (Signed)
-----   Message from Willeen Niece sent at 12/26/2023  8:54 AM EST ----- 1. Skin, L infraocular :       BASAL CELL CARCINOMA, NODULAR PATTERN   BCC skin cancer- already treated with EDC at time of biopsy   - please call patient

## 2023-12-26 NOTE — Telephone Encounter (Signed)
 Advised pt of bx results.  Scheduled pt for 75m f/u to recheck BCC./sh

## 2024-01-24 ENCOUNTER — Other Ambulatory Visit: Payer: Self-pay | Admitting: Family Medicine

## 2024-01-24 DIAGNOSIS — Z1231 Encounter for screening mammogram for malignant neoplasm of breast: Secondary | ICD-10-CM

## 2024-03-23 ENCOUNTER — Encounter

## 2024-06-18 ENCOUNTER — Ambulatory Visit
Admission: RE | Admit: 2024-06-18 | Discharge: 2024-06-18 | Disposition: A | Discharge status: Home / Self Care | Source: Ambulatory Visit | Attending: Family Medicine | Admitting: Family Medicine

## 2024-06-18 DIAGNOSIS — Z1231 Encounter for screening mammogram for malignant neoplasm of breast: Secondary | ICD-10-CM | POA: Insufficient documentation

## 2024-06-19 ENCOUNTER — Ambulatory Visit: Payer: Medicare PPO | Admitting: Dermatology

## 2024-06-19 DIAGNOSIS — Z85828 Personal history of other malignant neoplasm of skin: Secondary | ICD-10-CM

## 2024-06-19 DIAGNOSIS — L814 Other melanin hyperpigmentation: Secondary | ICD-10-CM | POA: Diagnosis not present

## 2024-06-19 DIAGNOSIS — L719 Rosacea, unspecified: Secondary | ICD-10-CM

## 2024-06-19 DIAGNOSIS — L821 Other seborrheic keratosis: Secondary | ICD-10-CM | POA: Diagnosis not present

## 2024-06-19 DIAGNOSIS — W908XXA Exposure to other nonionizing radiation, initial encounter: Secondary | ICD-10-CM

## 2024-06-19 DIAGNOSIS — L578 Other skin changes due to chronic exposure to nonionizing radiation: Secondary | ICD-10-CM | POA: Diagnosis not present

## 2024-06-19 DIAGNOSIS — D1801 Hemangioma of skin and subcutaneous tissue: Secondary | ICD-10-CM | POA: Diagnosis not present

## 2024-06-19 NOTE — Patient Instructions (Signed)

## 2024-06-19 NOTE — Progress Notes (Signed)
   Follow-Up Visit   Subjective  Amber Solomon is a 68 y.o. 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 Patient states she does have a place lower left eyelid present for a while.   The following portions of the chart were reviewed this encounter and updated as appropriate: medications, allergies, medical history  Review of Systems:  No other skin or systemic complaints except as noted in HPI or Assessment and Plan.  Objective  Well appearing patient in no apparent distress; mood and affect are within normal limits.  A focused examination was performed of the following areas: Face, L abdomen  Relevant exam findings are noted in the Assessment and Plan.         Assessment & Plan   ACTINIC DAMAGE - chronic, secondary to cumulative UV radiation exposure/sun exposure over time - diffuse scaly erythematous macules with underlying dyspigmentation - Recommend daily broad spectrum sunscreen SPF 30+ to sun-exposed areas, reapply every 2 hours as needed.  - Recommend staying in the shade or wearing long sleeves, sun glasses (UVA+UVB protection) and wide brim hats (4-inch brim around the entire circumference of the hat). - Call for new or changing lesions.  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  - Call if any new or changing lesions are noted between office visits  SEBORRHEIC KERATOSIS - 2 mm waxy, light tan papule at left lower eyelid, photo taken today. Recheck at f/u - Stuck-on, waxy, tan-brown papules and/or plaques at L lower flank at waistline.  - Benign-appearing - Discussed benign etiology and prognosis. - Observe - Call for any changes  LENTIGINES Exam:  scattered tan macules at face. Due to sun exposure Treatment Plan: Benign-appearing, observe. Recommend daily broad spectrum sunscreen SPF 30+ to sun-exposed areas, reapply every 2 hours as needed.  Call for any changes  HEMANGIOMA Exam: red papule(s) Discussed benign nature. Recommend observation. Call for changes.  ROSACEA Exam Mid face erythema with telangiectasias  Chronic and persistent condition with duration or expected duration over one year.  Not bothersome to patient at this time.   Rosacea is a chronic progressive skin condition usually affecting the face of adults, causing redness and/or acne bumps. It is treatable but not curable. It sometimes affects the eyes (ocular rosacea) as well. It may respond to topical and/or systemic medication and can flare with stress, sun exposure, alcohol, exercise, topical steroids (including hydrocortisone/cortisone 10) and some foods.  Daily application of broad spectrum spf 30+ sunscreen to face is recommended to reduce flares.  Patient denies grittiness of the eyes  Treatment Plan Mild, not bothersome to patient. Pt defers treatment at this time other than daily sunscreen.     Return for As scheduled, w/ Dr. Jackquline, [facility].  I, Jacquelynn V. Wilfred, CMA, am acting as scribe for Rexene Jackquline, MD .   Documentation: I have reviewed the above documentation for accuracy and completeness, and I agree with the above.  Rexene Jackquline, MD

## 2024-12-24 ENCOUNTER — Encounter: Payer: Medicare PPO | Admitting: Dermatology
